# Patient Record
Sex: Female | Born: 1965 | Race: Black or African American | Hispanic: No | Marital: Married | Smoking: Never smoker
Health system: Southern US, Community
[De-identification: ages and names within clinical notes are randomized; demographics above are authoritative.]

## PROBLEM LIST (undated history)

## (undated) DIAGNOSIS — Z8742 Personal history of other diseases of the female genital tract: Secondary | ICD-10-CM

## (undated) DIAGNOSIS — Z87898 Personal history of other specified conditions: Secondary | ICD-10-CM

## (undated) DIAGNOSIS — R6882 Decreased libido: Secondary | ICD-10-CM

## (undated) DIAGNOSIS — R634 Abnormal weight loss: Secondary | ICD-10-CM

## (undated) DIAGNOSIS — Z8719 Personal history of other diseases of the digestive system: Secondary | ICD-10-CM

## (undated) DIAGNOSIS — Z22330 Carrier of Group B streptococcus: Secondary | ICD-10-CM

## (undated) DIAGNOSIS — N643 Galactorrhea not associated with childbirth: Secondary | ICD-10-CM

## (undated) DIAGNOSIS — Z8619 Personal history of other infectious and parasitic diseases: Secondary | ICD-10-CM

## (undated) DIAGNOSIS — O09529 Supervision of elderly multigravida, unspecified trimester: Secondary | ICD-10-CM

## (undated) DIAGNOSIS — Z87440 Personal history of urinary (tract) infections: Secondary | ICD-10-CM

## (undated) HISTORY — DX: Carrier of group B Streptococcus: Z22.330

## (undated) HISTORY — DX: Decreased libido: R68.82

## (undated) HISTORY — DX: Personal history of urinary (tract) infections: Z87.440

## (undated) HISTORY — DX: Personal history of other diseases of the female genital tract: Z87.42

## (undated) HISTORY — DX: Galactorrhea not associated with childbirth: N64.3

## (undated) HISTORY — DX: Personal history of other specified conditions: Z87.898

## (undated) HISTORY — DX: Personal history of other infectious and parasitic diseases: Z86.19

## (undated) HISTORY — DX: Abnormal weight loss: R63.4

## (undated) HISTORY — DX: Personal history of other diseases of the digestive system: Z87.19

## (undated) HISTORY — DX: Supervision of elderly multigravida, unspecified trimester: O09.529

---

## 1996-09-05 DIAGNOSIS — Z87440 Personal history of urinary (tract) infections: Secondary | ICD-10-CM

## 1996-09-05 HISTORY — DX: Personal history of urinary (tract) infections: Z87.440

## 1998-01-29 ENCOUNTER — Other Ambulatory Visit: Admission: RE | Admit: 1998-01-29 | Discharge: 1998-01-29 | Payer: Self-pay | Admitting: Obstetrics and Gynecology

## 1999-06-22 ENCOUNTER — Other Ambulatory Visit: Admission: RE | Admit: 1999-06-22 | Discharge: 1999-06-22 | Payer: Self-pay | Admitting: Obstetrics and Gynecology

## 2000-07-11 ENCOUNTER — Other Ambulatory Visit: Admission: RE | Admit: 2000-07-11 | Discharge: 2000-07-11 | Payer: Self-pay | Admitting: Obstetrics and Gynecology

## 2000-12-25 ENCOUNTER — Other Ambulatory Visit: Admission: RE | Admit: 2000-12-25 | Discharge: 2000-12-25 | Payer: Self-pay | Admitting: Obstetrics and Gynecology

## 2001-06-27 ENCOUNTER — Inpatient Hospital Stay (HOSPITAL_COMMUNITY): Admission: AD | Admit: 2001-06-27 | Discharge: 2001-06-30 | Payer: Self-pay | Admitting: Obstetrics and Gynecology

## 2001-07-01 ENCOUNTER — Encounter: Admission: RE | Admit: 2001-07-01 | Discharge: 2001-07-31 | Payer: Self-pay | Admitting: Obstetrics and Gynecology

## 2001-09-05 DIAGNOSIS — R634 Abnormal weight loss: Secondary | ICD-10-CM

## 2001-09-05 DIAGNOSIS — Z87898 Personal history of other specified conditions: Secondary | ICD-10-CM

## 2001-09-05 HISTORY — DX: Personal history of other specified conditions: Z87.898

## 2001-09-05 HISTORY — DX: Abnormal weight loss: R63.4

## 2001-12-28 ENCOUNTER — Other Ambulatory Visit: Admission: RE | Admit: 2001-12-28 | Discharge: 2001-12-28 | Payer: Self-pay | Admitting: Obstetrics and Gynecology

## 2002-09-05 DIAGNOSIS — R6882 Decreased libido: Secondary | ICD-10-CM

## 2002-09-05 HISTORY — DX: Decreased libido: R68.82

## 2002-12-31 ENCOUNTER — Other Ambulatory Visit: Admission: RE | Admit: 2002-12-31 | Discharge: 2002-12-31 | Payer: Self-pay | Admitting: Obstetrics and Gynecology

## 2004-01-01 ENCOUNTER — Other Ambulatory Visit: Admission: RE | Admit: 2004-01-01 | Discharge: 2004-01-01 | Payer: Self-pay | Admitting: Obstetrics and Gynecology

## 2005-01-04 ENCOUNTER — Other Ambulatory Visit: Admission: RE | Admit: 2005-01-04 | Discharge: 2005-01-04 | Payer: Self-pay | Admitting: Obstetrics and Gynecology

## 2005-09-05 DIAGNOSIS — Z8742 Personal history of other diseases of the female genital tract: Secondary | ICD-10-CM

## 2005-09-05 DIAGNOSIS — Z8719 Personal history of other diseases of the digestive system: Secondary | ICD-10-CM

## 2005-09-05 HISTORY — DX: Personal history of other diseases of the digestive system: Z87.19

## 2005-09-05 HISTORY — DX: Personal history of other diseases of the female genital tract: Z87.42

## 2006-01-17 ENCOUNTER — Other Ambulatory Visit: Admission: RE | Admit: 2006-01-17 | Discharge: 2006-01-17 | Payer: Self-pay | Admitting: Obstetrics and Gynecology

## 2007-04-14 ENCOUNTER — Inpatient Hospital Stay (HOSPITAL_COMMUNITY): Admission: AD | Admit: 2007-04-14 | Discharge: 2007-04-15 | Payer: Self-pay | Admitting: Obstetrics and Gynecology

## 2007-09-06 DIAGNOSIS — Z87898 Personal history of other specified conditions: Secondary | ICD-10-CM

## 2007-09-06 DIAGNOSIS — N643 Galactorrhea not associated with childbirth: Secondary | ICD-10-CM

## 2007-09-06 HISTORY — DX: Personal history of other specified conditions: Z87.898

## 2007-09-06 HISTORY — DX: Galactorrhea not associated with childbirth: N64.3

## 2007-10-10 ENCOUNTER — Inpatient Hospital Stay (HOSPITAL_COMMUNITY): Admission: AD | Admit: 2007-10-10 | Discharge: 2007-10-10 | Payer: Self-pay | Admitting: Obstetrics and Gynecology

## 2007-12-03 ENCOUNTER — Inpatient Hospital Stay (HOSPITAL_COMMUNITY): Admission: RE | Admit: 2007-12-03 | Discharge: 2007-12-06 | Payer: Self-pay | Admitting: Obstetrics and Gynecology

## 2008-04-26 IMAGING — US US OB TRANSVAGINAL MODIFY
1 series · 14 of 28 positions shown · non-contrast
Comparison: none

CLINICAL DATA: 40-year-old with light brown discharge.  By LMP the patient is 5 weeks 3 days.  LMP 03/07/07.  Gravida 2, para 1. 
 OBSTETRICAL ULTRASOUND <14 WKS AND TRANSVAGINAL OB US:
TECHNIQUE: Both transabdominal and transvaginal ultrasound examinations were performed for complete evaluation of the gestation as well as the maternal uterus, adnexal regions, and pelvic cul-de-sac.

[Series 1: us ob comp less 14 wks · 14 of 48 slices shown]
[im 2/48]
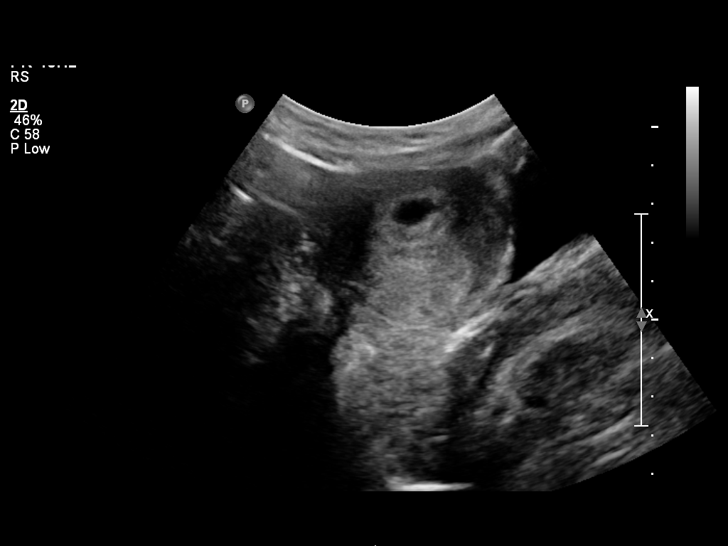
[im 6/48]
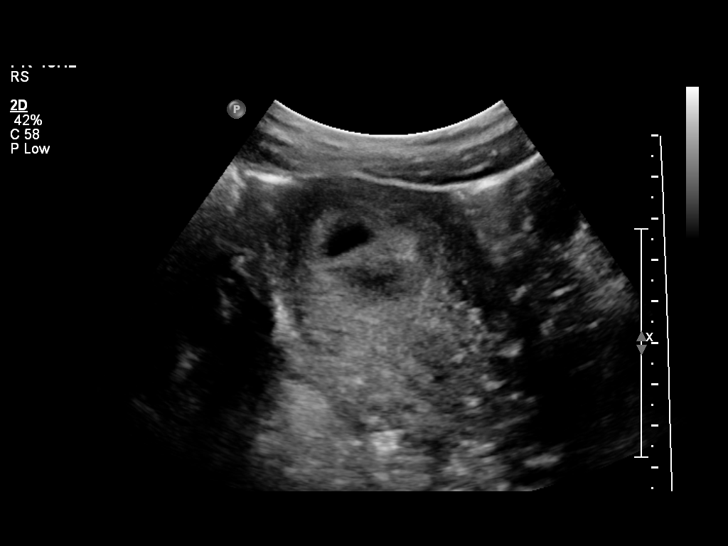
[im 9/48]
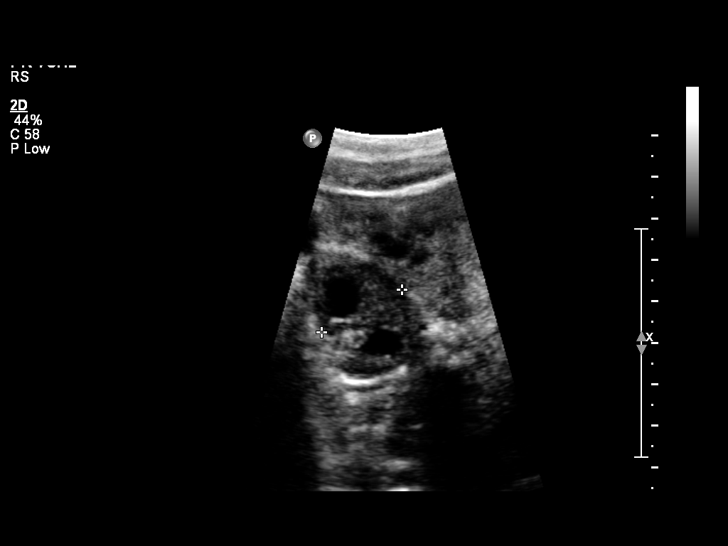
[im 13/48]
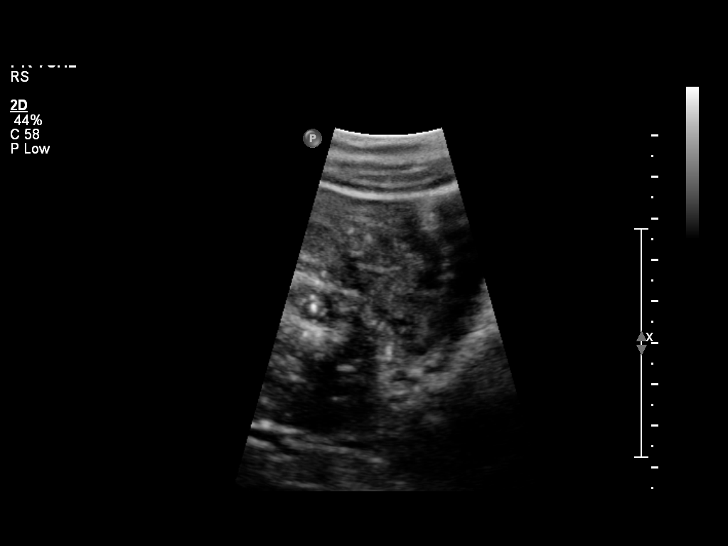
[im 16/48]
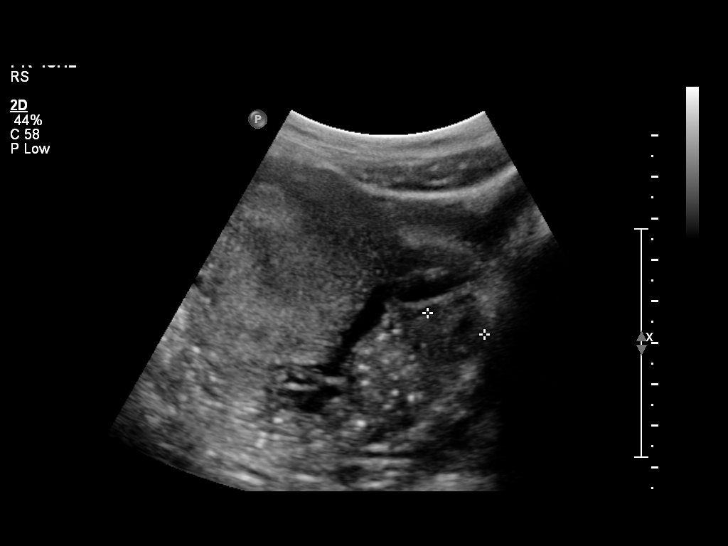
[im 20/48]
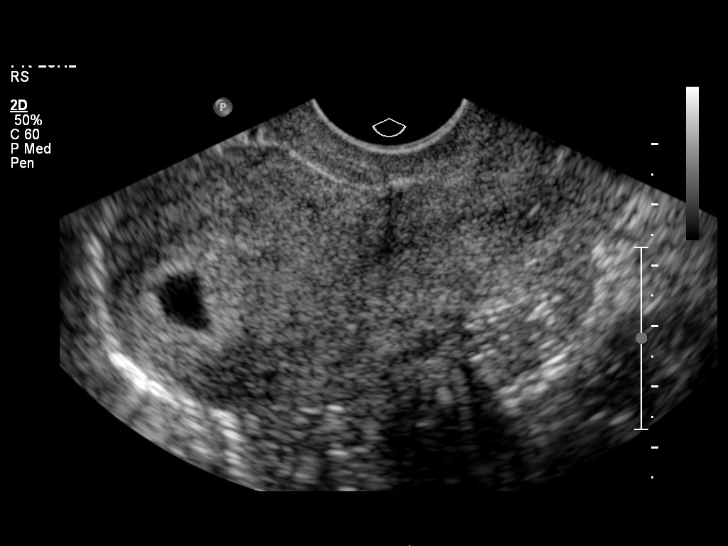
[im 23/48]
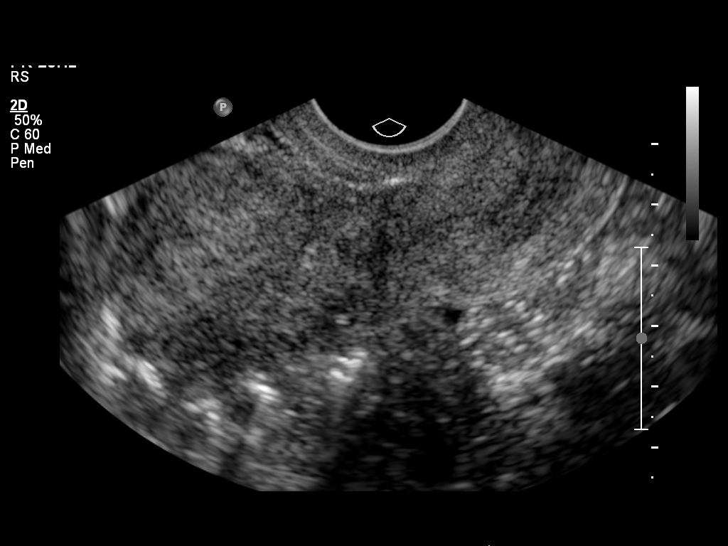
[im 27/48]
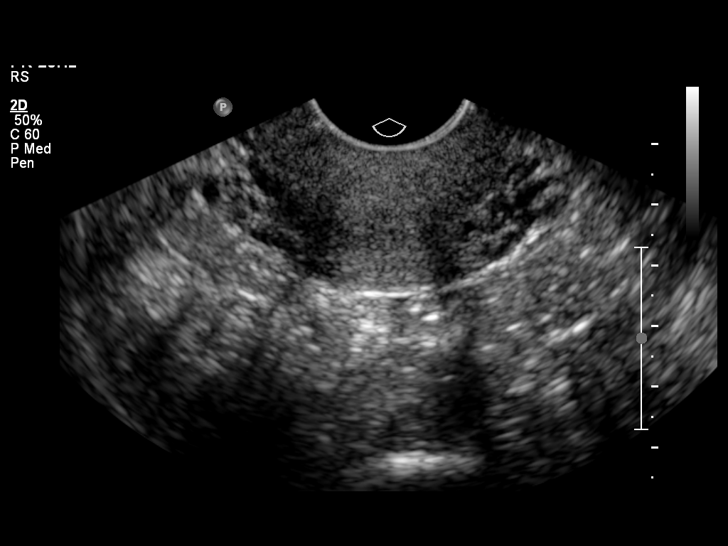
[im 30/48]
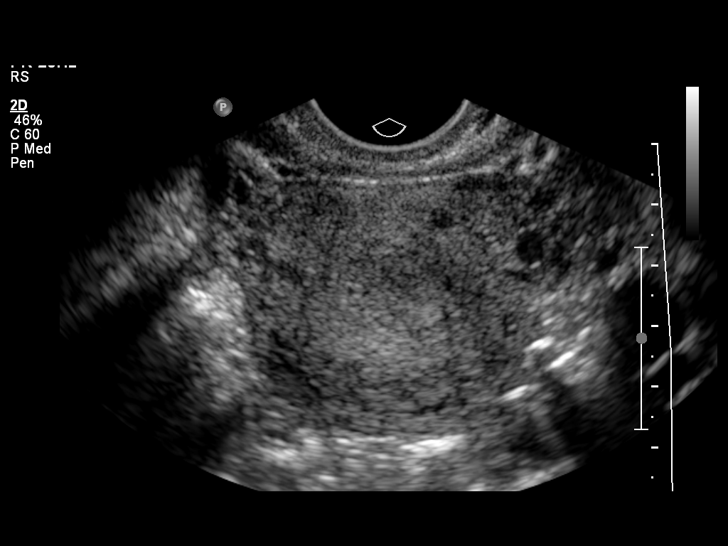
[im 34/48]
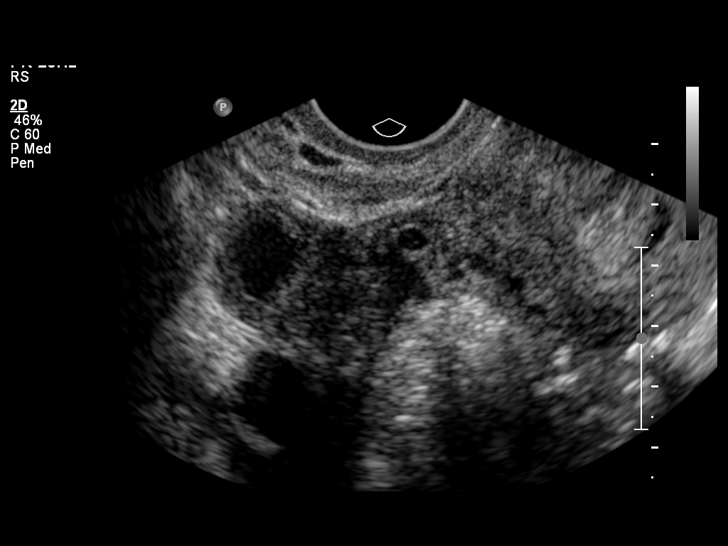
[im 37/48]
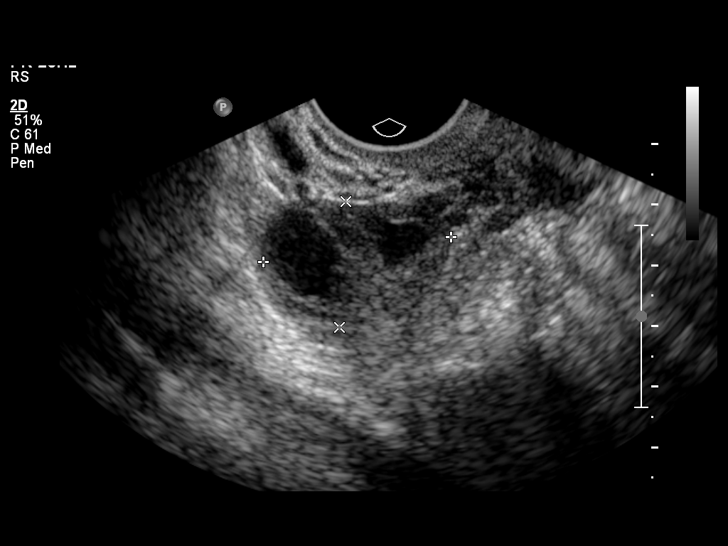
[im 41/48]
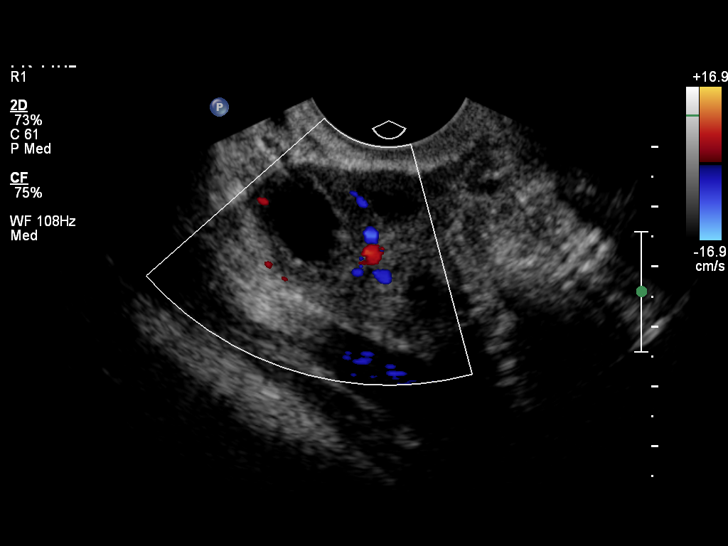
[im 44/48]
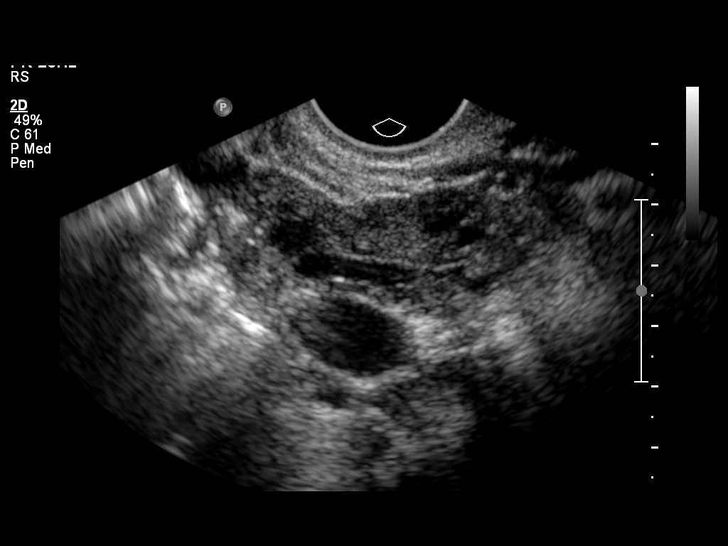
[im 48/48]
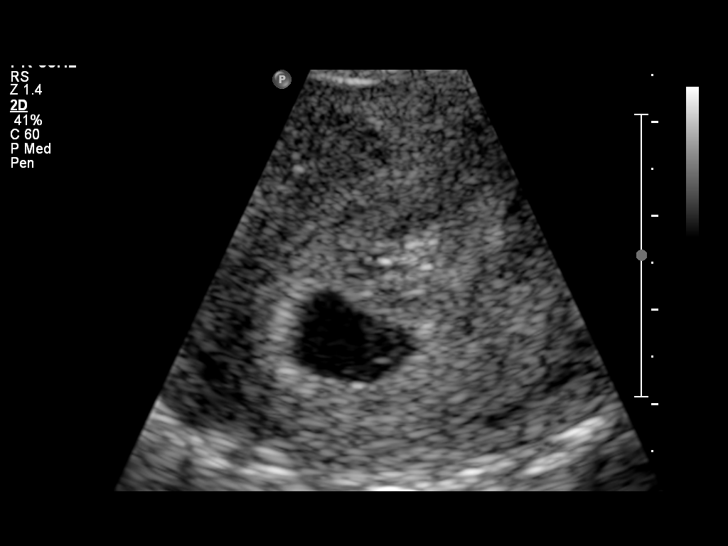

[14 of 28 positions shown; findings below may reference images not displayed]

FINDINGS: Within the uterus, there is a single gestational sac, which contains a yolk sac.  No embryo is identified at this point.  The mean sac diameter is 10 mm corresponding to an age 5 weeks 5 days.  No subchorionic hemorrhage is identified.  The ovaries have a normal appearance and right corpus luteum cyst is noted.  No free pelvic fluid identified.
IMPRESSION: Intrauterine gestational sac correlating well with the dating by LMP.  Follow-up ultrasound is suggested to document presence of embryonic pole.

## 2010-07-09 ENCOUNTER — Encounter: Admission: RE | Admit: 2010-07-09 | Discharge: 2010-07-09 | Payer: Self-pay | Admitting: Obstetrics and Gynecology

## 2010-09-26 ENCOUNTER — Encounter: Payer: Self-pay | Admitting: Obstetrics and Gynecology

## 2011-01-18 NOTE — Discharge Summary (Signed)
Jordan Caldwell, Jordan Caldwell             ACCOUNT NO.:  [id number]   MEDICAL RECORD NO.:  [id number]          PATIENT TYPE:  MAT   LOCATION:  [facility]C                          [facility]   PHYSICIAN:  Janine Limbo, M.D.DATE OF BIRTH:  1966/06/06   DATE OF ADMISSION:  12/03/2007  DATE OF DISCHARGE:  12/06/2007                               DISCHARGE SUMMARY   ADMITTING DIAGNOSES:  1. Intrauterine pregnancy at 39 weeks.  2. Previous cesarean section with desire for repeat.  3. Group beta strep negative.  4. Elevated trisomy 13 and 18 risk on first trimester screen but      declined amnio and with normal ultrasounds.   DISCHARGE DIAGNOSES:  1. Intrauterine pregnancy at term.  2. Status post repeat cesarean section of a viable female by name of      Jordan Caldwell weighing 6 pounds 2 ounces and Apgars of 9 and 9.  3. Breast feeding.  4. Dysuria with pending urine culture.  5. Desiring Depo shot while awaiting significant other's vasectomy.   PROCEDURES:  1. Repeat low transverse cesarean section.  2. Spinal anesthesia.   HOSPITAL COURSE:  [name] is a 45 year old gravida 3, para 1-0-1, [redacted]  weeks gestation who presented on the day of admission for a scheduled  repeat cesarean section.  Pregnancy had been remarkable for:  1. Previous cesarean section with desire for repeat.  2. Advanced maternal age.  3. Elevated risk of trisomy 18 and 13 on first trimester screen with a      normal ultrasound, however, she declined amniocentesis for      additional testing.  4. First trimester spotting.  5. Group beta strep negative.   On the day of admission the patient was prepped for OR and a C-section.  A repeat low transverse cesarean section was performed by Dr. Dierdre Forth, assisting was Nigel Bridgeman, under spinal anesthesia and  findings were a viable female infant by the name of Jordan Caldwell with a weight  of 6 pounds 2 ounces.  Apgars were 9 and 9.  The patient tolerated it  well and was sent  to the PACU in good condition.  Estimated blood loss  was 750 mL.  Her blood pressures after admission were slightly elevated.  Blood pressure was 140/90 with slight pedal edema and planning was made  by Dr. Pennie Rushing to watch blood pressures closely postop.  By  postoperative day #1 the patient was doing well.  She was ambulating and  voiding and eating without difficulty.  She stated discomfort with  initial flow of urine but then resolved.  She had a UTI in the first  week postpartum with her previous delivery and was breast feeding and  bottle feeding.  Hemoglobin was 11, down from 12.8.  Her vital signs  were stable.  She was afebrile.  Physical exam was within normal limits.  Abdomen was soft, appropriately tender.  Fundus was firm.  Incision  Steri-Strips were intact.  Lochia was normal.  Extremities with trace  edema.  Plan was made to send a cath UA prior to discharge home if she  was still having dysuria.  By postoperative day #2 she continued to be  doing well.  She said her vision was slightly blurry but no spots.  She  stated she had had the same issue after delivery of last child.  No  headaches or other PIH signs or symptoms.  She was breast feeding and  bottle feeding.  Continued mild dysuria but she stated not like a UTI.  Blood pressure 130/77, max was 140/81.  Other vital signs were stable.  Physical exam within normal limits.  DTRs 2+ without clonus, trace  edema.  Incision was clean, dry and intact.  Steri-Strips were also  intact.  Fundus was firm.  Scant lochia.  Extremities within normal  limits.  Plan was made for urine specimen to rule out UTI.  By  postoperative day #3 the patient was sitting up in bed breast feeding.  She was doing well and ready for discharge.  She continued to complain  of dysuria and was convinced that she definitely felt like she had a  UTI.  She also complained that her IV site on her left wrist was sore.  She said her lochia was like a  period.  Tolerating regular diet,  voiding spontaneously.  She was up ad lib.  Pain was well controlled  with Motrin and Percocet p.r.n.  Stated that her significant other was  planning vasectomy but did desire a Depo-Provera shot before discharge  for interim.  Per pediatrician the infant with low-set ears and the  pediatrician stated that they were getting of a DNA blood sample.  Objectively all vital signs were stable.  She was afebrile.  The morning  of postoperative day #3 blood pressure 119/77 and she was 97.8 orally.  The UA that was sent around 11:30 on December 05, 2007 was within normal  limits with exception of straw appearance and moderate blood.  Hemoglobin postop day number #1 was 11 and her urine culture and  sensitivity was still pending.  Physical exam, no acute distress, alert  and oriented x3.  Lungs were clear.  Abdomen soft and nontender.  Fundus  was firm below umbilicus.  Lochia small rubra.  Extremities were within  normal limits.  Negative Homans, no edema.  Steri-Strips were intact.  No drainage.  The patient was deemed to have received full benefit for  hospital stay and was discharged home.  The patient did state that the  antibiotic that she was given postoperative after her first delivery  worked well and she did not remember what it was, so she was given the  C.N.M. on-call number to call after she got home to see what the  antibiotic was.  She said she still had the file.   CONDITION ON DISCHARGE:  Discharge condition was good.   DISCHARGE INSTRUCTIONS:  Per [facility] pamphlet.   DISCHARGE MEDICATIONS:  1. Motrin 600 mg p.o. q.6 h p.r.n. pain.  2. Tylox 1-2 tablets p.o. q. 46 hours p.r.n. moderate-to-severe pain.  3. Macrobid 100 mg p.o. b.i.d. x7 days to start secondary to      complaints while urine culture pending.  4. Prenatal vitamin 1 tablet p.o. daily.   OTHER DISCHARGE INSTRUCTIONS:  The patient was encouraged to increase  her water intake as well as  __________  supplement and yogurt with  probiotic while on Macrobid.   DISCHARGE FOLLOWUP:  To occur in 6 weeks or p.r.n. and C.N.M. on call  will check for culture and sensitivity results tomorrow.  The patient  also received a Depo shot 150 mg IM prior to discharge home.      Candice Denny Levy, [place]      Janine Limbo, M.D.  Electronically Signed    CHS/MEDQ  D:  12/06/2007  T:  12/06/2007  Job:  [id number]
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
## 2011-01-18 NOTE — Op Note (Signed)
Jordan Caldwell, HINNENKAMP             ACCOUNT NO.:  [id number]   MEDICAL RECORD NO.:  [id number]          PATIENT TYPE:  INP   LOCATION:  9124                          [facility]   PHYSICIAN:  Hal Morales, M.D.DATE OF BIRTH:  [birth year]   DATE OF PROCEDURE:  12/03/2007  DATE OF DISCHARGE:                               OPERATIVE REPORT   PREOPERATIVE DIAGNOSIS:  Intrauterine pregnancy at 31 weeks' gestation,  prior cesarean section, desire for repeat cesarean section.   POSTOPERATIVE DIAGNOSIS:  Intrauterine pregnancy at 58 weeks' gestation,  prior cesarean section, desire for repeat cesarean section.   OPERATION:  Repeat low transverse cesarean section.   SURGEON:  Dr. Dierdre Forth.   FIRST ASSISTANT:  Erin Sons, certified nurse midwife.   ANESTHESIA:  Spinal.   ESTIMATED BLOOD LOSS:  750 mL.   COMPLICATIONS:  None.   FINDINGS:  The patient was delivered of a female infant whose name is  [name] with a weight pending at the time of the dictation.  The Apgars  were 9 and 9 at one and five minutes respectively.  The uterus, tubes  and ovaries were normal for the gravid state.   PROCEDURE:  The patient was taken to the operating room after  appropriate identification and placed on the operating table.  After the  attainment of a spinal anesthetic, she was placed in the supine position  with a left lateral tilt.  The abdomen and perineum were prepped with  multiple layers of Betadine and a Foley catheter inserted into the  bladder and connected to straight drainage.  The abdomen was draped as a  sterile field.  After assurance of adequate anesthesia.  The site of the  previous cesarean section incision was infiltrated with 10 mL of 0.25%  Marcaine.  A transverse incision was made at the site of the previous  cesarean section incision and the abdomen opened in layers.  The  peritoneum was entered and the bladder blade placed.  The bladder from  the previous cesarean  section had been advanced up onto the uterine  fundus and the serosa was incised allowing the bladder to be pushed  caudad.  The uterus was then incised approximately 2 cm above the  uterovesical fold and that incision taken laterally on either side  bluntly.  The infant was delivered from the occiput posterior position  and after having the nares and pharynx suctioned and the cord clamped  and cut, was handed off to the awaiting pediatricians.  The placenta was  allowed to spontaneously separate from the uterus and was removed from  the operative field.  The uterine incision was closed with a running  interlocking suture of 0 Vicryl.  A tear in the uterus towards the  caudad region was repaired with a running interlocking suture of 0  Vicryl.  An imbricating suture of 0 Vicryl was then placed and  hemostatic figure-of-eight sutures placed for adequate hemostasis.  Copious irrigation was carried out and the abdominal peritoneum closed  with a running suture of 2-0 Vicryl.  The rectus muscles were  reapproximated in the midline with  figure-of-eight suture of 2-0 Vicryl.  The rectus fascia was closed with a running suture of 0 Vicryl then  reinforced on either side of midline with figure-of-eight sutures of 0  Vicryl.  The subcutaneous tissue was irrigated and made hemostatic with  Bovie cautery.  A subcuticular suture of 3-0 Monocryl was then used to  close the skin incision.  Steri-Strips and a sterile dressing were  applied and the patient taken from the operating room to the recovery  room in satisfactory condition having tolerated the procedure well with  sponge and instrument counts correct.  The placenta went to the labor  and delivery suite after the employees from [facility] [employer]  allowed for retrieval of cord blood.      Hal Morales, M.D.  Electronically Signed     VPH/MEDQ  D:  12/03/2007  T:  12/03/2007  Job:  [id number]

## 2011-01-21 NOTE — Discharge Summary (Signed)
[facility]  Patient:    Jordan Caldwell, Jordan Caldwell Visit Number: [id number] MRN: [id number]          Service Type: OBS Location: 910A 9132 01 Attending Physician:  Shaune Spittle Dictated by:   Pierre Bali Normand Sloop, M.D. Admit Date:  06/27/2001 Discharge Date: 06/30/2001                             Discharge Summary  DIAGNOSES:                     Pregnancy at 39 weeks, breech, with some fibroids.  HOSPITAL COURSE:              Patient had a primary low transverse cesarean section without difficulty.  On postoperative day #1 she was afebrile with stable vital signs. Postoperative hemoglobin was 11.1 from 13.1.  Patient was breast-feeding and doing routine postoperative care.  The remainder of her postoperative course was stable.  She remained afebrile with stable vital signs.  Abdomen soft and nontender.  Incision was clean, dry, and intact. Patient was sent home on day #3 to follow up in the office for routine postoperative care. Dictated by:   Pierre Bali. Normand Sloop, M.D. Attending Physician:  Shaune Spittle DD:  07/17/01 TD:  07/18/01 Job: [id number]
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
## 2011-01-21 NOTE — Op Note (Signed)
[facility]  Patient:    Jordan Caldwell, Jordan Caldwell Visit Number: [id number] MRN: [id number]          Service Type: OBS Location: [id number] Attending Physician:  Shaune Spittle Dictated by:   Maris Berger. Pennie Rushing, M.D. Proc. Date: 06/27/01 Admit Date:  06/27/2001                             Operative Report  PREOPERATIVE DIAGNOSES:       1. Intrauterine pregnancy at 39-1/[redacted] weeks                                  gestation.                               2. Breech presentation.  POSTOPERATIVE DIAGNOSES:      1. Intrauterine pregnancy at 39-1/[redacted] weeks                                  gestation.                               2. Breech presentation.                               3. Uterine fibroids.  OPERATION:                    Primary low transverse cesarean section.  SURGEON:                      Vanessa P. Pennie Rushing, M.D.  FIRST ASSISTANT:              Mack Guise, C.N.M.  ANESTHESIA:                   Spinal.  ESTIMATED BLOOD LOSS:         700 cc.  COMPLICATIONS:                None.  FINDINGS:                     The patient was delivered of a female infant whose name is Jordan Caldwell, weighing 6 lb 10 oz with Apgars of 9 and 9 at one and five minutes respectively.  The tubes and ovaries were normal for the gravid state. The uterus contained several small, less than 2 cm myomata and then one 5 cm myomata at the right cornual region.  DESCRIPTION OF PROCEDURE:     The patient was taken to the operating room after appropriate identification and placed on the operating table.  An ultrasound had been done in the preoperative area to document the head in the left upper quadrant.  The patient was placed on the operating table and a spinal anesthetic placed.  She was then placed in the supine position with a left lateral tilt.  The abdomen and perineum were prepped with multiple layers of Betadine and a Foley catheter inserted into the bladder  and connected to straight drainage.  The abdomen was draped as a sterile field.  After assurance of adequate anesthesia,  a transverse incision was made in the abdomen and the abdomen opened in layers.  The peritoneum was entered and a bladder blade placed.  The uterus was incised approximately 1 cm above the uterovesical fold and the incision extended bluntly.  The infant was delivered from the left sacrum anterior position and, after having the nares and pharynx suctioned and the cord clamped and cut, was handed off to the awaiting pediatricians.  The appropriate cord blood was drawn and the placenta noted to have separated from the uterus.  It was removed from the operative field and the uterine incision closed with a running interlocking suture of 0 Vicryl. An imbricating suture of 0 Vicryl was then placed.  Hemostasis was noted to be adequate.  Copious irrigation was carried out and the abdominal peritoneum closed with a running suture of 2-0 Vicryl.  The rectus muscles were reapproximated in the midline with a figure-of-eight suture of 2-0 Vicryl. The rectus muscles were noted to be hemostatic and the rectus fascia closed with a running suture of 0 Vicryl then reinforced on either side of midline with figure-of-eight sutures of 0 Vicryl.  The subcutaneous tissue was irrigated and made hemostatic with Bovie cautery.  Skin staples were applied to the skin incision.  A sterile dressing was applied.  The patient was taken from the operating room to the recovery room in satisfactory condition, having tolerated the procedure well with sponge and instrument counts correct.  THe infant went to the full-term nursery. Dictated by:   Maris Berger. Pennie Rushing, M.D. Attending Physician:  Shaune Spittle DD:  06/27/01 TD:  06/27/01 Job: [id number]

## 2011-05-27 LAB — URINALYSIS, ROUTINE W REFLEX MICROSCOPIC
Nitrite: NEGATIVE
Protein, ur: NEGATIVE
Specific Gravity, Urine: 1.01
Urobilinogen, UA: 0.2

## 2011-05-27 LAB — URINE MICROSCOPIC-ADD ON

## 2011-05-30 LAB — CBC
MCHC: 34.1
MCHC: 34.7
MCV: 91.2
Platelets: 160
Platelets: 179
RBC: 3.54 — ABNORMAL LOW
RDW: 13.8
WBC: 10.1

## 2011-05-31 LAB — URINALYSIS, ROUTINE W REFLEX MICROSCOPIC
Bilirubin Urine: NEGATIVE
Ketones, ur: NEGATIVE
Nitrite: NEGATIVE
Specific Gravity, Urine: 1.01
pH: 7

## 2011-05-31 LAB — URINE CULTURE: Special Requests: NEGATIVE

## 2011-05-31 LAB — URINE MICROSCOPIC-ADD ON: WBC, UA: NONE SEEN

## 2011-06-20 LAB — URINALYSIS, ROUTINE W REFLEX MICROSCOPIC
Leukocytes, UA: NEGATIVE
Nitrite: NEGATIVE
Protein, ur: NEGATIVE
Specific Gravity, Urine: 1.015
Urobilinogen, UA: 0.2

## 2011-06-20 LAB — URINE MICROSCOPIC-ADD ON

## 2011-06-20 LAB — WET PREP, GENITAL: Clue Cells Wet Prep HPF POC: NONE SEEN

## 2011-11-23 ENCOUNTER — Ambulatory Visit (INDEPENDENT_AMBULATORY_CARE_PROVIDER_SITE_OTHER): Payer: BC Managed Care – PPO | Admitting: Emergency Medicine

## 2011-11-23 ENCOUNTER — Ambulatory Visit: Payer: BC Managed Care – PPO

## 2011-11-23 VITALS — BP 165/97 | HR 85 | Temp 98.6°F | Resp 16 | Ht 63.0 in | Wt 118.0 lb

## 2011-11-23 DIAGNOSIS — I10 Essential (primary) hypertension: Secondary | ICD-10-CM | POA: Insufficient documentation

## 2011-11-23 DIAGNOSIS — R059 Cough, unspecified: Secondary | ICD-10-CM

## 2011-11-23 DIAGNOSIS — IMO0001 Reserved for inherently not codable concepts without codable children: Secondary | ICD-10-CM

## 2011-11-23 DIAGNOSIS — M791 Myalgia, unspecified site: Secondary | ICD-10-CM

## 2011-11-23 DIAGNOSIS — R05 Cough: Secondary | ICD-10-CM

## 2011-11-23 MED ORDER — BENZONATATE 200 MG PO CAPS: ORAL_CAPSULE | ORAL | Status: DC

## 2011-11-23 MED ORDER — AMLODIPINE BESYLATE 5 MG PO TABS: 5.0000 mg | ORAL_TABLET | Freq: Every day | ORAL | Status: AC

## 2011-11-23 NOTE — Progress Notes (Signed)
  Subjective:    Patient ID: Jordan Caldwell, female    DOB: 01/27/1966, 46 y.o.   MRN: [id number]  HPI patient enters with onset Friday of fever 101-102 associated with this she did have myalgias associated with abdominal discomfort and diarrhea. She's had a headache associated with this. She subsequently developed a cough which has been essentially nonproductive her abdominal symptoms have resolved. She is sore in her chest trauma or coughing.    Review of Systems  Constitutional: Positive for fever and fatigue.  HENT: Positive for neck pain.   Eyes: Negative.   Respiratory: Positive for cough.   Cardiovascular: Negative.   Gastrointestinal: Positive for abdominal pain and diarrhea.  Genitourinary: Negative.        Objective:   Physical Exam  Constitutional: She appears well-developed and well-nourished.  HENT:  Head: Normocephalic.  Right Ear: External ear normal.  Left Ear: External ear normal.  Eyes: Pupils are equal, round, and reactive to light. Right eye exhibits no discharge. Left eye exhibits no discharge. No scleral icterus.  Neck: No JVD present. No tracheal deviation present. No thyromegaly present.  Cardiovascular: Normal rate, regular rhythm and normal heart sounds.   Pulmonary/Chest: Effort normal and breath sounds normal.  Abdominal: Soft. She exhibits no distension. There is no tenderness. There is no guarding.  Lymphadenopathy:    She has no cervical adenopathy.   [facility] reading (PRIMARY) by  Dr.Montanna Mcbain chest x-ray shows no acute disease.        Assessment & Plan:   Patient here with what sounds like a viral type illness which started last Friday. She initially had GI symptoms but they are mostly respiratory at the present time. She does not look acutely ill. Complicating feature has been a history of hypertension and on evaluation today her pressure is elevated and will need to be addressed. We'll give him Tessalon Perles to help with cough plan start on  Norvasc 5 mg one a day for blood pressure control plan on any further evaluation couple weeks.

## 2011-11-23 NOTE — Patient Instructions (Signed)
Hypertension As your heart beats, it forces blood through your arteries. This force is your blood pressure. If the pressure is too high, it is called hypertension (HTN) or high blood pressure. HTN is dangerous because you may have it and not know it. High blood pressure may mean that your heart has to work harder to pump blood. Your arteries may be narrow or stiff. The extra work puts you at risk for heart disease, stroke, and other problems.  Blood pressure consists of two numbers, a higher number over a lower, 110/72, for example. It is stated as "110 over 72." The ideal is below 120 for the top number (systolic) and under 80 for the bottom (diastolic). Write down your blood pressure today. You should pay close attention to your blood pressure if you have certain conditions such as:  Heart failure.   Prior heart attack.   Diabetes   Chronic kidney disease.   Prior stroke.   Multiple risk factors for heart disease.  To see if you have HTN, your blood pressure should be measured while you are seated with your arm held at the level of the heart. It should be measured at least twice. A one-time elevated blood pressure reading (especially in the Emergency Department) does not mean that you need treatment. There may be conditions in which the blood pressure is different between your right and left arms. It is important to see your caregiver soon for a recheck. Most people have essential hypertension which means that there is not a specific cause. This type of high blood pressure may be lowered by changing lifestyle factors such as:  Stress.   Smoking.   Lack of exercise.   Excessive weight.   Drug/tobacco/alcohol use.   Eating less salt.  Most people do not have symptoms from high blood pressure until it has caused damage to the body. Effective treatment can often prevent, delay or reduce that damage. TREATMENT  When a cause has been identified, treatment for high blood pressure is  directed at the cause. There are a large number of medications to treat HTN. These fall into several categories, and your caregiver will help you select the medicines that are best for you. Medications may have side effects. You should review side effects with your caregiver. If your blood pressure stays high after you have made lifestyle changes or started on medicines,   Your medication(s) may need to be changed.   Other problems may need to be addressed.   Be certain you understand your prescriptions, and know how and when to take your medicine.   Be sure to follow up with your caregiver within the time frame advised (usually within two weeks) to have your blood pressure rechecked and to review your medications.   If you are taking more than one medicine to lower your blood pressure, make sure you know how and at what times they should be taken. Taking two medicines at the same time can result in blood pressure that is too low.  SEEK IMMEDIATE MEDICAL CARE IF:  You develop a severe headache, blurred or changing vision, or confusion.   You have unusual weakness or numbness, or a faint feeling.   You have severe chest or abdominal pain, vomiting, or breathing problems.  MAKE SURE YOU:   Understand these instructions.   Will watch your condition.   Will get help right away if you are not doing well or get worse.  Document Released: 08/22/2005 Document Revised: 08/11/2011 Document Reviewed:   04/11/2008 Exit[employer] Patient Information 2012 [employer].Cough, Adult  A cough is a reflex that helps clear your throat and airways. It can help heal the body or may be a reaction to an irritated airway. A cough may only last 2 or 3 weeks (acute) or may last more than 8 weeks (chronic).  CAUSES Acute cough:  Viral or bacterial infections.  Chronic cough:  Infections.   Allergies.   Asthma.   Post-nasal drip.   Smoking.   Heartburn or acid reflux.   Some medicines.   Chronic lung  problems (COPD).   Cancer.  SYMPTOMS   Cough.   Fever.   Chest pain.   Increased breathing rate.   High-pitched whistling sound when breathing (wheezing).   Colored mucus that you cough up (sputum).  TREATMENT   A bacterial cough may be treated with antibiotic medicine.   A viral cough must run its course and will not respond to antibiotics.   Your caregiver may recommend other treatments if you have a chronic cough.  HOME CARE INSTRUCTIONS   Only take over-the-counter or prescription medicines for pain, discomfort, or fever as directed by your caregiver. Use cough suppressants only as directed by your caregiver.   Use a cold steam vaporizer or humidifier in your bedroom or home to help loosen secretions.   Sleep in a semi-upright position if your cough is worse at night.   Rest as needed.   Stop smoking if you smoke.  SEEK IMMEDIATE MEDICAL CARE IF:   You have pus in your sputum.   Your cough starts to worsen.   You cannot control your cough with suppressants and are losing sleep.   You begin coughing up blood.   You have difficulty breathing.   You develop pain which is getting worse or is uncontrolled with medicine.   You have a fever.  MAKE SURE YOU:   Understand these instructions.   Will watch your condition.   Will get help right away if you are not doing well or get worse.  Document Released: 02/18/2011 Document Revised: 08/11/2011 Document Reviewed: 02/18/2011 [employer] Patient Information 2012 [employer]. 

## 2012-01-02 ENCOUNTER — Other Ambulatory Visit (INDEPENDENT_AMBULATORY_CARE_PROVIDER_SITE_OTHER): Payer: BC Managed Care – PPO

## 2012-01-02 DIAGNOSIS — Z3009 Encounter for other general counseling and advice on contraception: Secondary | ICD-10-CM

## 2012-01-02 MED ORDER — MEDROXYPROGESTERONE ACETATE 150 MG/ML IM SUSP
150.0000 mg | Freq: Once | INTRAMUSCULAR | Status: AC
  Administered 2012-01-02: 150 mg via INTRAMUSCULAR

## 2012-01-02 NOTE — Progress Notes (Unsigned)
Next Depo due-03-25-2012

## 2012-03-26 ENCOUNTER — Other Ambulatory Visit (INDEPENDENT_AMBULATORY_CARE_PROVIDER_SITE_OTHER): Payer: Self-pay

## 2012-03-26 DIAGNOSIS — Z3009 Encounter for other general counseling and advice on contraception: Secondary | ICD-10-CM

## 2012-03-26 MED ORDER — MEDROXYPROGESTERONE ACETATE 150 MG/ML IM SUSP
150.0000 mg | Freq: Once | INTRAMUSCULAR | Status: AC
  Administered 2012-03-26: 150 mg via INTRAMUSCULAR

## 2012-03-26 NOTE — Progress Notes (Unsigned)
Next Depo due 06-17-2012 

## 2012-06-18 ENCOUNTER — Other Ambulatory Visit (INDEPENDENT_AMBULATORY_CARE_PROVIDER_SITE_OTHER): Payer: Self-pay

## 2012-06-18 DIAGNOSIS — Z3009 Encounter for other general counseling and advice on contraception: Secondary | ICD-10-CM

## 2012-06-18 MED ORDER — MEDROXYPROGESTERONE ACETATE 150 MG/ML IM SUSP
150.0000 mg | Freq: Once | INTRAMUSCULAR | Status: AC
  Administered 2012-06-18: 150 mg via INTRAMUSCULAR

## 2012-06-18 NOTE — Progress Notes (Unsigned)
Next Depo due-09-09-2012

## 2012-06-27 ENCOUNTER — Ambulatory Visit: Payer: Self-pay | Admitting: Obstetrics and Gynecology

## 2012-09-06 ENCOUNTER — Telehealth: Payer: Self-pay | Admitting: Obstetrics and Gynecology

## 2012-09-06 MED ORDER — MEDROXYPROGESTERONE ACETATE 150 MG/ML IM SUSP: 150.0000 mg | Freq: Once | INTRAMUSCULAR | Status: AC

## 2012-09-06 NOTE — Telephone Encounter (Signed)
Tc to pt. Rx for Depo Provera e-pres to pharm on file. Pt agrees.

## 2012-09-10 ENCOUNTER — Other Ambulatory Visit: Payer: Self-pay

## 2012-12-05 IMAGING — CR DG CHEST 2V
2 series · 2 of 2 positions shown · non-contrast
Comparison: Preliminary reading of Dr. Boza

CLINICAL DATA: Cough

CHEST - 2 VIEW

[PA]
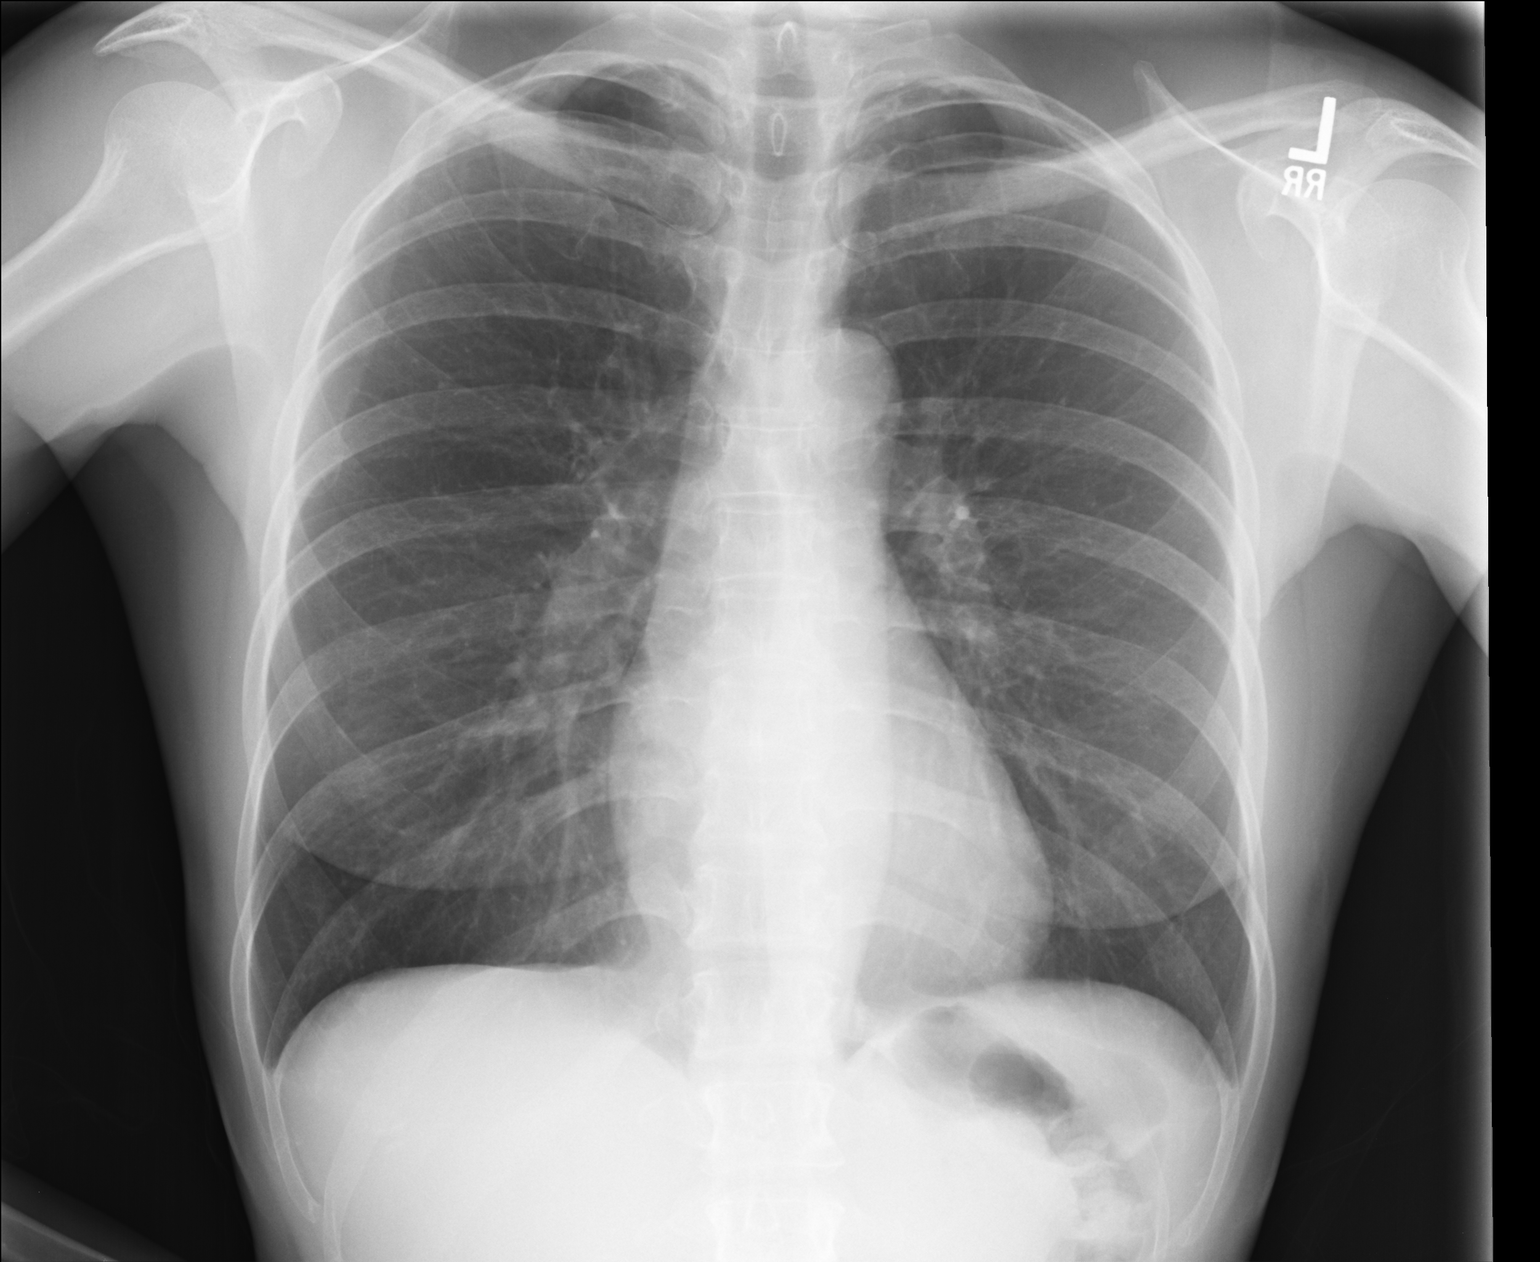

[lateral]
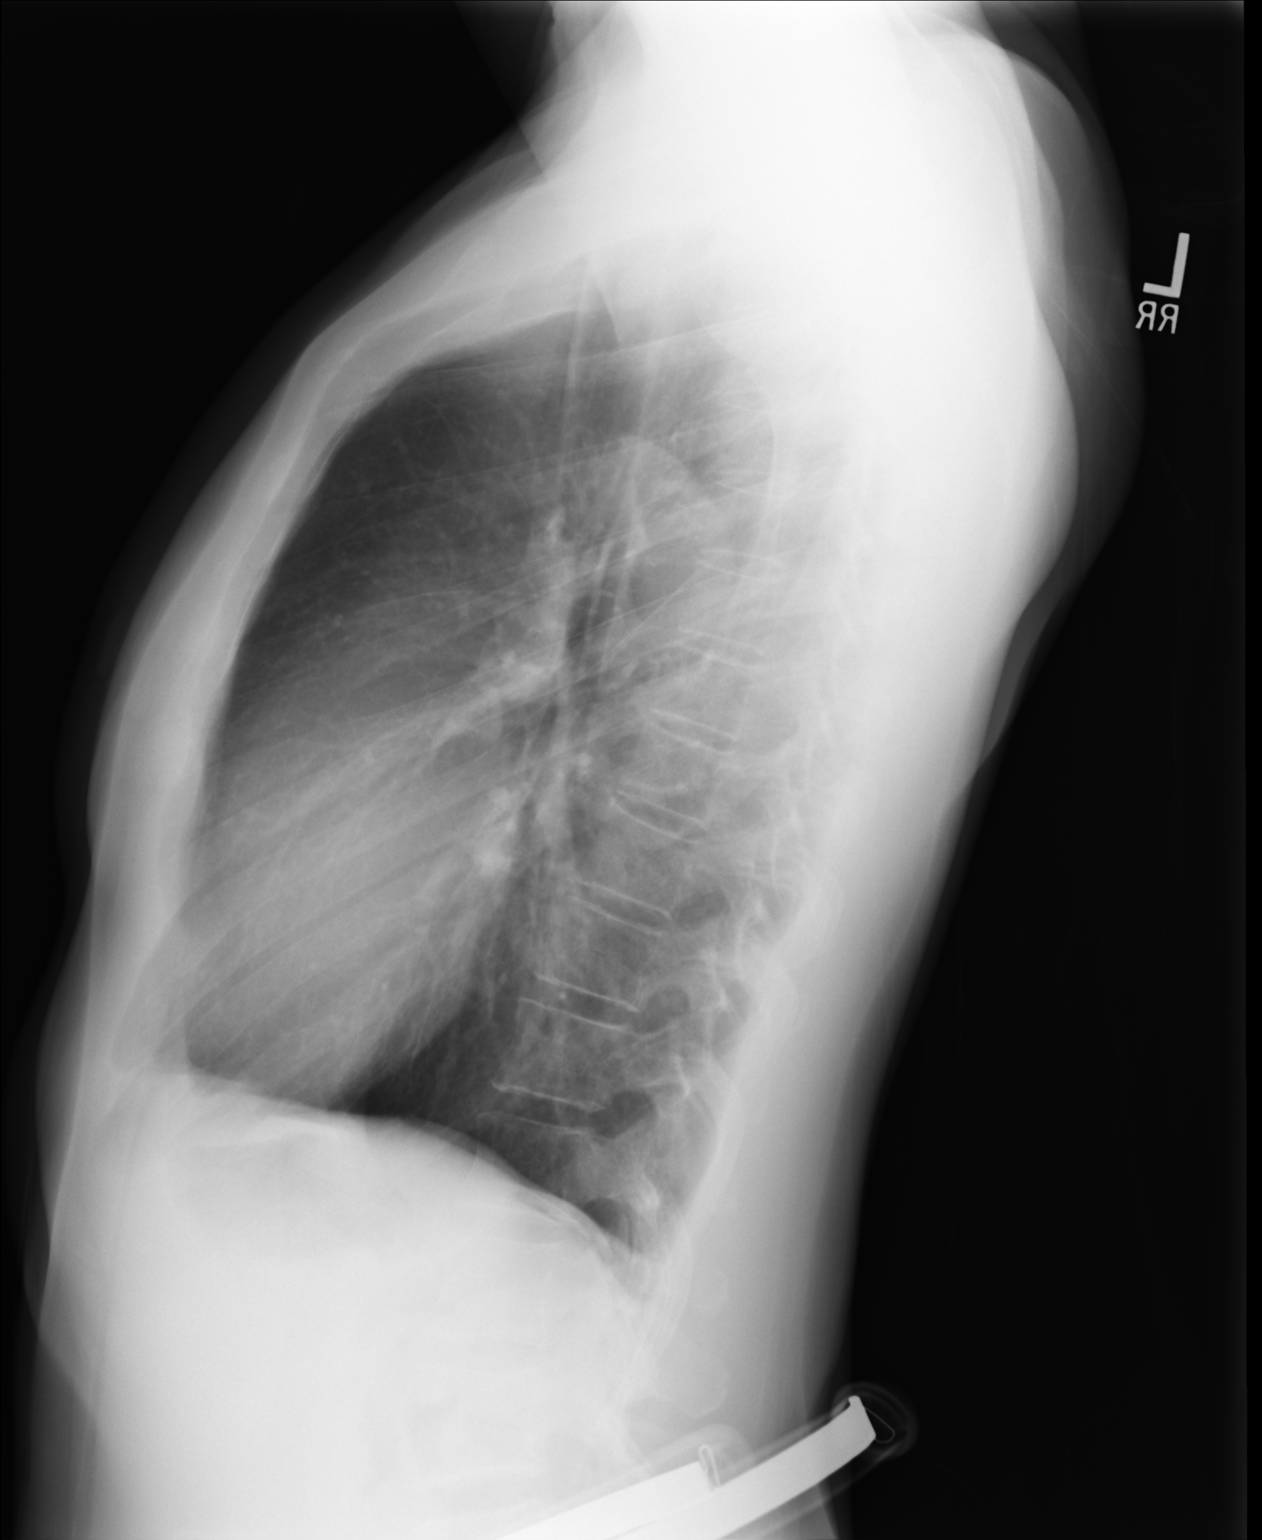

[2 of 2 positions shown; findings below may reference images not displayed]

FINDINGS: Cardiomediastinal silhouette is unremarkable.  No acute
infiltrate or pleural effusion.  No pulmonary edema.  The bony
thorax is unremarkable.
IMPRESSION: No active disease.

## 2014-07-30 ENCOUNTER — Ambulatory Visit (INDEPENDENT_AMBULATORY_CARE_PROVIDER_SITE_OTHER): Payer: BLUE CROSS/BLUE SHIELD | Admitting: Emergency Medicine

## 2014-07-30 VITALS — BP 120/80 | HR 70 | Temp 97.6°F | Resp 16 | Ht 63.0 in | Wt 108.0 lb

## 2014-07-30 DIAGNOSIS — J029 Acute pharyngitis, unspecified: Secondary | ICD-10-CM

## 2014-07-30 LAB — POCT RAPID STREP A (OFFICE): Rapid Strep A Screen: NEGATIVE

## 2014-07-30 MED ORDER — FIRST-DUKES MOUTHWASH MT SUSP: OROMUCOSAL | Status: AC

## 2014-07-30 NOTE — Patient Instructions (Signed)

## 2014-07-30 NOTE — Progress Notes (Signed)
   Subjective:    Patient ID: Jordan Caldwell, female    DOB: Jan 24, 1966, 48 y.o.   MRN: [id number] This chart was scribed for Lesle ChrisSteven Bricelyn Freestone, MD by Jolene Provostobert Halas, Medical Scribe. This patient was seen in Room 3 and the patient's care was started at 9:02 AM.  Chief Complaint  Patient presents with  . Sore Throat    x 1 day    HPI HPI Comments: Jordan Caldwell is a 48 y.o. female who [facility] complaining of sore throat for the last two days. Pt endorses associated swollen glands, left-sided ear pain, difficulty swallowing, facial pain, and mild cough. Pt denies fever, chills or sick contacts. Pt states that she has not had a sore throat in 15 years.     Review of Systems  Constitutional: Negative for fever and chills.  HENT: Positive for ear pain and sore throat.        Facial pain.  Respiratory: Positive for cough (Mild).        Objective:   Physical Exam  Constitutional: She is oriented to person, place, and time. She appears well-developed and well-nourished.  HENT:  Head: Normocephalic and atraumatic.  Throat with left sided erythema and edema. No ulceration no exudate.  Eyes: Pupils are equal, round, and reactive to light.  Neck: Neck supple.  1/1 left anterior cervical node.  Cardiovascular: Normal rate and regular rhythm.   Pulmonary/Chest: Effort normal and breath sounds normal. No respiratory distress.  Lymphadenopathy:    She has cervical adenopathy.  Neurological: She is alert and oriented to person, place, and time.  Skin: Skin is warm and dry.  Psychiatric: She has a normal mood and affect. Her behavior is normal.  Nursing note and vitals reviewed.  Results for orders placed or performed in visit on 07/30/14  POCT rapid strep A  Result Value Ref Range   Rapid Strep A Screen Negative Negative       Assessment & Plan:  Strep test was negative. We'll treat symptomatically with salt water gargles dux mouthwash and ibuprofen. Culture was sent.I  personally performed the services described in this documentation, which was scribed in my presence. The recorded information has been reviewed and is accurate.

## 2014-08-01 LAB — CULTURE, GROUP A STREP: Organism ID, Bacteria: NORMAL

## 2016-08-04 DIAGNOSIS — N939 Abnormal uterine and vaginal bleeding, unspecified: Secondary | ICD-10-CM | POA: Diagnosis not present

## 2016-08-04 DIAGNOSIS — R5383 Other fatigue: Secondary | ICD-10-CM | POA: Diagnosis not present

## 2016-08-17 DIAGNOSIS — N840 Polyp of corpus uteri: Secondary | ICD-10-CM | POA: Diagnosis not present

## 2016-08-17 DIAGNOSIS — D649 Anemia, unspecified: Secondary | ICD-10-CM | POA: Diagnosis not present

## 2016-08-17 DIAGNOSIS — D259 Leiomyoma of uterus, unspecified: Secondary | ICD-10-CM | POA: Diagnosis not present

## 2016-08-17 DIAGNOSIS — N939 Abnormal uterine and vaginal bleeding, unspecified: Secondary | ICD-10-CM | POA: Diagnosis not present

## 2016-08-26 DIAGNOSIS — N920 Excessive and frequent menstruation with regular cycle: Secondary | ICD-10-CM | POA: Diagnosis not present

## 2016-08-26 DIAGNOSIS — N858 Other specified noninflammatory disorders of uterus: Secondary | ICD-10-CM | POA: Diagnosis not present

## 2016-10-05 DIAGNOSIS — Z1231 Encounter for screening mammogram for malignant neoplasm of breast: Secondary | ICD-10-CM | POA: Diagnosis not present

## 2016-10-05 DIAGNOSIS — Z01411 Encounter for gynecological examination (general) (routine) with abnormal findings: Secondary | ICD-10-CM | POA: Diagnosis not present

## 2016-10-05 DIAGNOSIS — Z124 Encounter for screening for malignant neoplasm of cervix: Secondary | ICD-10-CM | POA: Diagnosis not present

## 2016-10-05 DIAGNOSIS — Z681 Body mass index (BMI) 19 or less, adult: Secondary | ICD-10-CM | POA: Diagnosis not present

## 2016-11-02 DIAGNOSIS — N939 Abnormal uterine and vaginal bleeding, unspecified: Secondary | ICD-10-CM | POA: Diagnosis not present

## 2016-11-24 DIAGNOSIS — Z0000 Encounter for general adult medical examination without abnormal findings: Secondary | ICD-10-CM | POA: Diagnosis not present

## 2016-11-24 DIAGNOSIS — Z8742 Personal history of other diseases of the female genital tract: Secondary | ICD-10-CM | POA: Diagnosis not present

## 2016-11-24 DIAGNOSIS — Z1211 Encounter for screening for malignant neoplasm of colon: Secondary | ICD-10-CM | POA: Diagnosis not present

## 2016-11-24 DIAGNOSIS — Z87440 Personal history of urinary (tract) infections: Secondary | ICD-10-CM | POA: Diagnosis not present

## 2016-11-24 DIAGNOSIS — I10 Essential (primary) hypertension: Secondary | ICD-10-CM | POA: Diagnosis not present

## 2016-12-14 DIAGNOSIS — Z13220 Encounter for screening for lipoid disorders: Secondary | ICD-10-CM | POA: Diagnosis not present

## 2017-07-31 DIAGNOSIS — M542 Cervicalgia: Secondary | ICD-10-CM | POA: Diagnosis not present

## 2017-07-31 DIAGNOSIS — M7581 Other shoulder lesions, right shoulder: Secondary | ICD-10-CM | POA: Diagnosis not present

## 2017-10-20 DIAGNOSIS — Z6820 Body mass index (BMI) 20.0-20.9, adult: Secondary | ICD-10-CM | POA: Diagnosis not present

## 2017-10-20 DIAGNOSIS — Z01411 Encounter for gynecological examination (general) (routine) with abnormal findings: Secondary | ICD-10-CM | POA: Diagnosis not present

## 2017-10-20 DIAGNOSIS — Z124 Encounter for screening for malignant neoplasm of cervix: Secondary | ICD-10-CM | POA: Diagnosis not present

## 2017-11-28 DIAGNOSIS — I10 Essential (primary) hypertension: Secondary | ICD-10-CM | POA: Diagnosis not present

## 2017-11-28 DIAGNOSIS — R200 Anesthesia of skin: Secondary | ICD-10-CM | POA: Diagnosis not present

## 2017-11-28 DIAGNOSIS — Z87440 Personal history of urinary (tract) infections: Secondary | ICD-10-CM | POA: Diagnosis not present

## 2017-11-28 DIAGNOSIS — Z0000 Encounter for general adult medical examination without abnormal findings: Secondary | ICD-10-CM | POA: Diagnosis not present

## 2018-01-26 DIAGNOSIS — D125 Benign neoplasm of sigmoid colon: Secondary | ICD-10-CM | POA: Diagnosis not present

## 2018-01-26 DIAGNOSIS — K635 Polyp of colon: Secondary | ICD-10-CM | POA: Diagnosis not present

## 2018-01-26 DIAGNOSIS — Z1211 Encounter for screening for malignant neoplasm of colon: Secondary | ICD-10-CM | POA: Diagnosis not present

## 2018-08-31 DIAGNOSIS — N939 Abnormal uterine and vaginal bleeding, unspecified: Secondary | ICD-10-CM | POA: Diagnosis not present

## 2018-08-31 DIAGNOSIS — N950 Postmenopausal bleeding: Secondary | ICD-10-CM | POA: Diagnosis not present

## 2018-09-12 DIAGNOSIS — N939 Abnormal uterine and vaginal bleeding, unspecified: Secondary | ICD-10-CM | POA: Diagnosis not present

## 2018-09-12 DIAGNOSIS — N882 Stricture and stenosis of cervix uteri: Secondary | ICD-10-CM | POA: Diagnosis not present

## 2018-09-12 DIAGNOSIS — N950 Postmenopausal bleeding: Secondary | ICD-10-CM | POA: Diagnosis not present

## 2018-11-30 DIAGNOSIS — Z87440 Personal history of urinary (tract) infections: Secondary | ICD-10-CM | POA: Diagnosis not present

## 2018-11-30 DIAGNOSIS — I10 Essential (primary) hypertension: Secondary | ICD-10-CM | POA: Diagnosis not present

## 2019-08-19 ENCOUNTER — Other Ambulatory Visit: Payer: Self-pay | Admitting: Family Medicine

## 2019-08-19 DIAGNOSIS — Z1231 Encounter for screening mammogram for malignant neoplasm of breast: Secondary | ICD-10-CM

## 2020-03-26 DIAGNOSIS — I10 Essential (primary) hypertension: Secondary | ICD-10-CM | POA: Diagnosis not present

## 2020-08-25 DIAGNOSIS — Z87440 Personal history of urinary (tract) infections: Secondary | ICD-10-CM | POA: Diagnosis not present

## 2020-08-25 DIAGNOSIS — I10 Essential (primary) hypertension: Secondary | ICD-10-CM | POA: Diagnosis not present

## 2020-08-25 DIAGNOSIS — Z13220 Encounter for screening for lipoid disorders: Secondary | ICD-10-CM | POA: Diagnosis not present

## 2020-08-25 DIAGNOSIS — Z8616 Personal history of COVID-19: Secondary | ICD-10-CM | POA: Diagnosis not present

## 2020-08-25 DIAGNOSIS — Z0000 Encounter for general adult medical examination without abnormal findings: Secondary | ICD-10-CM | POA: Diagnosis not present

## 2020-08-25 DIAGNOSIS — M25569 Pain in unspecified knee: Secondary | ICD-10-CM | POA: Diagnosis not present

## 2021-03-03 DIAGNOSIS — N951 Menopausal and female climacteric states: Secondary | ICD-10-CM | POA: Diagnosis not present

## 2021-03-03 DIAGNOSIS — Z1231 Encounter for screening mammogram for malignant neoplasm of breast: Secondary | ICD-10-CM | POA: Diagnosis not present

## 2021-03-03 DIAGNOSIS — Z124 Encounter for screening for malignant neoplasm of cervix: Secondary | ICD-10-CM | POA: Diagnosis not present

## 2021-03-03 DIAGNOSIS — Z01419 Encounter for gynecological examination (general) (routine) without abnormal findings: Secondary | ICD-10-CM | POA: Diagnosis not present
